# Patient Record
Sex: Female | Born: 1960 | Race: White | Hispanic: No | Marital: Married
Health system: Midwestern US, Community
[De-identification: ages and names within clinical notes are randomized; demographics above are authoritative.]

## PROBLEM LIST (undated history)

## (undated) DIAGNOSIS — F419 Anxiety disorder, unspecified: Secondary | ICD-10-CM

## (undated) DIAGNOSIS — F329 Major depressive disorder, single episode, unspecified: Secondary | ICD-10-CM

## (undated) DIAGNOSIS — M1990 Unspecified osteoarthritis, unspecified site: Secondary | ICD-10-CM

## (undated) DIAGNOSIS — F32A Depression, unspecified: Secondary | ICD-10-CM

## (undated) DIAGNOSIS — J449 Chronic obstructive pulmonary disease, unspecified: Secondary | ICD-10-CM

## (undated) DIAGNOSIS — E119 Type 2 diabetes mellitus without complications: Secondary | ICD-10-CM

## (undated) DIAGNOSIS — I10 Essential (primary) hypertension: Secondary | ICD-10-CM

## (undated) DIAGNOSIS — T7840XA Allergy, unspecified, initial encounter: Secondary | ICD-10-CM

## (undated) HISTORY — DX: Essential (primary) hypertension: I10

## (undated) HISTORY — PX: CHOLECYSTECTOMY: SHX55

## (undated) HISTORY — DX: Anxiety disorder, unspecified: F41.9

## (undated) HISTORY — DX: Chronic obstructive pulmonary disease, unspecified: J44.9

## (undated) HISTORY — DX: Depression, unspecified: F32.A

## (undated) HISTORY — PX: APPENDECTOMY: SHX54

## (undated) HISTORY — PX: COSMETIC SURGERY: SHX468

## (undated) HISTORY — DX: Type 2 diabetes mellitus without complications: E11.9

## (undated) HISTORY — PX: JOINT REPLACEMENT: SHX530

## (undated) HISTORY — DX: Unspecified osteoarthritis, unspecified site: M19.90

## (undated) HISTORY — DX: Major depressive disorder, single episode, unspecified: F32.9

## (undated) HISTORY — DX: Allergy, unspecified, initial encounter: T78.40XA

---

## 2014-09-19 ENCOUNTER — Ambulatory Visit (INDEPENDENT_AMBULATORY_CARE_PROVIDER_SITE_OTHER): Payer: TRICARE For Life (TFL)

## 2014-09-19 ENCOUNTER — Ambulatory Visit (INDEPENDENT_AMBULATORY_CARE_PROVIDER_SITE_OTHER): Payer: TRICARE For Life (TFL) | Admitting: Family Medicine

## 2014-09-19 VITALS — BP 160/62 | HR 78 | Temp 97.2°F | Resp 20 | Ht 64.75 in | Wt 185.6 lb

## 2014-09-19 DIAGNOSIS — M25561 Pain in right knee: Secondary | ICD-10-CM

## 2014-09-19 DIAGNOSIS — J4531 Mild persistent asthma with (acute) exacerbation: Secondary | ICD-10-CM

## 2014-09-19 DIAGNOSIS — T7840XA Allergy, unspecified, initial encounter: Secondary | ICD-10-CM

## 2014-09-19 MED ORDER — ALBUTEROL SULFATE HFA 108 (90 BASE) MCG/ACT IN AERS: 1.0000 | INHALATION_SPRAY | Freq: Four times a day (QID) | RESPIRATORY_TRACT | Status: AC | PRN

## 2014-09-19 MED ORDER — EPINEPHRINE 0.3 MG/0.3ML IJ SOAJ: 0.3000 mg | Freq: Once | INTRAMUSCULAR | Status: AC

## 2014-09-19 MED ORDER — PREDNISONE 20 MG PO TABS: ORAL_TABLET | ORAL | Status: AC

## 2014-09-19 NOTE — Progress Notes (Signed)
[facility] [address] [place] [address] [phone]  Date:  09/19/2014   Name:  Cheryl Parks   DOB:  May 06, 1960   MRN:  [id number]  PCP:  No PCP Per Patient    Chief Complaint: Shortness of Breath; Allergic Reaction; Pruritis; Knee Pain; Sore Throat; and Insomnia   History of Present Illness:  Cheryl Parks is a 54 y.o. very pleasant female patient who presents with the following:  She has thinning hair, and two days ago went to a stylist who did a "weave cap" for her. At first she was ok, but yesterday she noted redness and itching of her scalp and neck.  The itching seems to be spreading over her body.  She is taking benadryl but notes that her scalp is still very itchy.  She feels panicky due to her itching but does not have any wheezing or angioedema  She does have DM controlled with oral medications.  She is able to check her glucose at home and notes that it never gets over 175 or so.  She has used prednisone in the past without ill effeect  She has had her right knee replaced x2.  She twisted it yesterday and it is swollen and tender again- would like to have an x-ray to make sure that her hardware in is place  There are no active problems to display for this patient.   Past Medical History  Diagnosis Date  . Allergy   . Anxiety   . Arthritis   . COPD (chronic obstructive pulmonary disease)   . Depression   . Diabetes mellitus without complication   . Hypertension     Past Surgical History  Procedure Laterality Date  . Appendectomy    . Cholecystectomy    . Cosmetic surgery    . Joint replacement      History  Substance Use Topics  . Smoking status: Not on file  . Smokeless tobacco: Not on file  . Alcohol Use: Not on file    Family History  Problem Relation Age of Onset  . Cancer Mother   . Hyperlipidemia Mother   . Hypertension Mother   . Cancer Father   . Diabetes Father   . Hyperlipidemia Father   . Hypertension  Father     Allergies  Allergen Reactions  . Ciprofloxacin Hcl     Medication list has been reviewed and updated.  No current outpatient prescriptions on file prior to visit.   No current facility-administered medications on file prior to visit.    Review of Systems:  As per HPI- otherwise negative.   Physical Examination: Filed Vitals:   09/19/14 1527  BP: 160/62  Pulse: 78  Temp: 97.2 F (36.2 C)  Resp: 20   Filed Vitals:   09/19/14 1527  Height: 5' 4.75" (1.645 m)  Weight: 185 lb 9.6 oz (84.188 kg)   Body mass index is 31.11 kg/(m^2). Ideal Body Weight: Weight in (lb) to have BMI = 25: 148.8  GEN: WDWN, NAD, Non-toxic, A & O x 3, scratching at her skin, especially her scalp.  Redness and slight urticaria noted at her hairline HEENT: Atraumatic, Normocephalic. Neck supple. No masses, No LAD.  No angioedema Ears and Nose: No external deformity. CV: RRR, No M/G/R. No JVD. No thrill. No extra heart sounds. PULM: CTA B, no wheezes, crackles, rhonchi. No retractions. No resp. distress. No accessory muscle use. EXTR: No c/c/e NEURO Normal gait.  Right knee  shows large scars (old, healed) over anterior knee, some crepitus with ROM but full ROM.  Knee feels stable, no heat or effusion noted.  No redness PSYCH: Normally interactive. Conversant. Not depressed or anxious appearing.  Calm demeanor.   [facility] reading (PRIMARY) by  Dr. Patsy Lagerop[place]. 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  Removed as above, she felt better rx for epipen to have in case of more serious symptoms Course of prednisone for itching as below- she will watch her sugar Gave her a letter regarding her artificial joint to use for the metal detector when she visits her cousin in jail.    Signed Abbe AmsterdamJessica Ayeden Gladman, MD

## 2014-09-19 NOTE — Patient Instructions (Addendum)
Your knee looks ok- the hardware seems to be in place.  Please follow-up with your knee surgeon For your allergic reaction, continue using benadryl as needed for a couple of days You can also use the prednisone taper for 6 days- remember this will raise your blood sugar Keep an eye on your sugar- if going over 300 or so please let me know  Use the epipen if you have any difficulty breathing or swelling of your lips or tongue  Take an OTC zyrtec daily for the next several days  [facility] [address]

## 2014-09-20 ENCOUNTER — Emergency Department (INDEPENDENT_AMBULATORY_CARE_PROVIDER_SITE_OTHER)
Admission: EM | Admit: 2014-09-20 | Discharge: 2014-09-20 | Disposition: A | Discharge status: Home / Self Care | Source: Home / Self Care | Attending: Emergency Medicine | Admitting: Emergency Medicine

## 2014-09-20 ENCOUNTER — Encounter (HOSPITAL_COMMUNITY): Payer: Self-pay | Admitting: Emergency Medicine

## 2014-09-20 DIAGNOSIS — B085 Enteroviral vesicular pharyngitis: Secondary | ICD-10-CM | POA: Diagnosis not present

## 2014-09-20 DIAGNOSIS — H6983 Other specified disorders of Eustachian tube, bilateral: Secondary | ICD-10-CM | POA: Diagnosis not present

## 2014-09-20 LAB — POCT RAPID STREP A: Streptococcus, Group A Screen (Direct): NEGATIVE

## 2014-09-20 MED ORDER — LIDOCAINE VISCOUS 2 % MT SOLN: 5.0000 mL | OROMUCOSAL | Status: AC | PRN

## 2014-09-20 NOTE — Discharge Instructions (Signed)
You have a virus causing your symptoms. Use the viscous lidocaine every 4 hours as needed for pain. You should see improvement in the next 3-5 days. Follow-up as needed.

## 2014-09-20 NOTE — ED Notes (Signed)
Pt comes in with c/o sore throat, hoarse and blisters on tongue States she was seen in another Urgent Care last week, treated for Allergic reaction with oral steroids Rapid strep obtained

## 2014-09-20 NOTE — ED Provider Notes (Signed)
CSN: [id number]     Arrival date & time 09/20/14  1729 History   First MD Initiated Contact with Patient 09/20/14 1854     Chief Complaint  Patient presents with  . Sore Throat   (Consider location/radiation/quality/duration/timing/severity/associated sxs/prior Treatment) HPI  Past Medical History  Diagnosis Date  . Allergy   . Anxiety   . Arthritis   . COPD (chronic obstructive pulmonary disease)   . Depression   . Diabetes mellitus without complication   . Hypertension    Past Surgical History  Procedure Laterality Date  . Appendectomy    . Cholecystectomy    . Cosmetic surgery    . Joint replacement     Family History  Problem Relation Age of Onset  . Cancer Mother   . Hyperlipidemia Mother   . Hypertension Mother   . Cancer Father   . Diabetes Father   . Hyperlipidemia Father   . Hypertension Father    History  Substance Use Topics  . Smoking status: Not on file  . Smokeless tobacco: Not on file  . Alcohol Use: Not on file   OB History    No data available     Review of Systems  Allergies  Ciprofloxacin hcl  Home Medications   Prior to Admission medications   Medication Sig Start Date End Date Taking? Authorizing Provider  albuterol (PROVENTIL HFA;VENTOLIN HFA) 108 (90 BASE) MCG/ACT inhaler Inhale 1-2 puffs into the lungs every 6 (six) hours as needed for wheezing or shortness of breath. 09/19/14   Gwenlyn FoundJessica C Copland, MD  amLODipine (NORVASC) 10 MG tablet Take 10 mg by mouth daily.    Historical Provider, MD  aspirin 81 MG tablet Take 81 mg by mouth daily.    Historical Provider, MD  atorvastatin (LIPITOR) 10 MG tablet Take 10 mg by mouth daily.    Historical Provider, MD  B Complex Vitamins (B COMPLEX-B12 PO) Take by mouth.    Historical Provider, MD  B Complex-C-E-Zn (STRESS B/ZINC) TABS Take by mouth.    Historical Provider, MD  busPIRone (BUSPAR) 10 MG tablet Take 10 mg by mouth 3 (three) times daily.    Historical Provider, MD  cyclobenzaprine  (AMRIX) 15 MG 24 hr capsule Take 15 mg by mouth daily as needed for muscle spasms.    Historical Provider, MD  doxepin (SINEQUAN) 10 MG capsule Take 10 mg by mouth at bedtime.    Historical Provider, MD  EPINEPHrine 0.3 mg/0.3 mL IJ SOAJ injection Inject 0.3 mLs (0.3 mg total) into the muscle once. Use if needed for allergic reaction emergency 09/19/14   Pearline CablesJessica C Copland, MD  fenofibrate (TRICOR) 145 MG tablet Take 145 mg by mouth daily.    Historical Provider, MD  FLUoxetine (PROZAC) 40 MG capsule Take 40 mg by mouth daily.    Historical Provider, MD  GREEN COFFEE BEAN PO Take by mouth.    Historical Provider, MD  HYDROmorphone HCl (EXALGO) 12 MG T24A SR tablet Take 12 mg by mouth 2 (two) times daily.    Historical Provider, MD  lidocaine (XYLOCAINE) 2 % solution Use as directed 5 mLs in the mouth or throat every 4 (four) hours as needed for mouth pain. 09/20/14   Charm RingsErin J Tesla Keeler, MD  LOSARTAN POTASSIUM PO Take by mouth.    Historical Provider, MD  Melatonin 1 MG TABS Take by mouth.    Historical Provider, MD  metFORMIN (GLUCOPHAGE) 1000 MG tablet Take 1,000 mg by mouth 2 (two) times daily with a  meal.    Historical Provider, MD  NON FORMULARY     Historical Provider, MD  oxycodone (ROXICODONE) 30 MG immediate release tablet Take 30 mg by mouth every 4 (four) hours as needed for pain.    Historical Provider, MD  predniSONE (DELTASONE) 20 MG tablet Take 2 pills a day for 3 days, then 1 pill a day for 3 days 09/19/14   Pearline Cables, MD  promethazine (PHENERGAN) 25 MG tablet Take 25 mg by mouth every 6 (six) hours as needed for nausea or vomiting.    Historical Provider, MD  sitaGLIPtin (JANUVIA) 100 MG tablet Take 100 mg by mouth daily.    Historical Provider, MD  tiZANidine (ZANAFLEX) 4 MG capsule Take 4 mg by mouth daily.    Historical Provider, MD  UNABLE TO FIND Steroid scalp injection    Historical Provider, MD   Pulse 92  Temp(Src) 98.6 F (37 C) (Oral)  Resp 14  SpO2 100% Physical  Exam  ED Course  Procedures (including critical care time) Labs Review Labs Reviewed  POCT RAPID STREP A    Imaging Review Dg Knee Complete 4 Views Right  09/20/2014   CLINICAL DATA:  Fall and twisted right knee. Lateral right knee pain. Initial encounter.  EXAM: RIGHT KNEE - COMPLETE 4+ VIEW  COMPARISON:  None.  FINDINGS: There is no evidence of fracture, dislocation, or joint effusion. Total knee arthroplasty seen with all 3 components in expected position. No other significant bone abnormality identified.  IMPRESSION: No acute findings.  Previous total knee arthroplasty.   Electronically Signed   By: Myles Rosenthal M.D.   On: 09/20/2014 08:36     MDM   1. Herpangina    This appears to be viral. Viscous lidocaine for comfort. Follow-up as needed.    Charm Rings, MD 09/20/14 2006

## 2014-09-20 NOTE — ED Provider Notes (Signed)
Formatting of this note is different from the original.  CSN: [id number]     Arrival date & time 09/20/14  1729  History    First MD Initiated Contact with Patient 09/20/14 1854      Chief Complaint   Patient presents with   ? Sore Throat     (Consider location/radiation/quality/duration/timing/severity/associated sxs/prior  Treatment)  HPI    Past Medical History   Diagnosis Date   ? Allergy    ? Anxiety    ? Arthritis    ? COPD (chronic obstructive pulmonary disease)    ? Depression    ? Diabetes mellitus without complication    ? Hypertension      Past Surgical History   Procedure Laterality Date   ? Appendectomy     ? Cholecystectomy     ? Cosmetic surgery     ? Joint replacement       Family History   Problem Relation Age of Onset   ? Cancer Mother    ? Hyperlipidemia Mother    ? Hypertension Mother    ? Cancer Father    ? Diabetes Father    ? Hyperlipidemia Father    ? Hypertension Father      History   Substance Use Topics   ? Smoking status: Not on file   ? Smokeless tobacco: Not on file   ? Alcohol Use: Not on file     OB History     No data available       Review of Systems    Allergies   Ciprofloxacin hcl    Home Medications     Prior to Admission medications    Medication Sig Start Date End Date Taking? Authorizing Provider   albuterol (PROVENTIL HFA;VENTOLIN HFA) 108 (90 BASE) MCG/ACT inhaler Inhale 1-2 puffs into the lungs every 6 (six) hours as needed for wheezing or shortness of breath. 09/19/14   Gwenlyn FoundJessica C Copland, MD   amLODipine (NORVASC) 10 MG tablet Take 10 mg by mouth daily.    Historical Provider, MD   aspirin 81 MG tablet Take 81 mg by mouth daily.    Historical Provider, MD   atorvastatin (LIPITOR) 10 MG tablet Take 10 mg by mouth daily.    Historical Provider, MD   B Complex Vitamins (B COMPLEX-B12 PO) Take by mouth.    Historical Provider, MD   B Complex-C-E-Zn (STRESS B/ZINC) TABS Take by mouth.    Historical Provider, MD   busPIRone (BUSPAR) 10 MG tablet Take 10 mg by mouth 3 (three) times  daily.    Historical Provider, MD   cyclobenzaprine (AMRIX) 15 MG 24 hr capsule Take 15 mg by mouth daily as needed for muscle spasms.    Historical Provider, MD   doxepin (SINEQUAN) 10 MG capsule Take 10 mg by mouth at bedtime.    Historical Provider, MD   EPINEPHrine 0.3 mg/0.3 mL IJ SOAJ injection Inject 0.3 mLs (0.3 mg total) into the muscle once. Use if needed for allergic reaction emergency 09/19/14   Pearline CablesJessica C Copland, MD   fenofibrate (TRICOR) 145 MG tablet Take 145 mg by mouth daily.    Historical Provider, MD   FLUoxetine (PROZAC) 40 MG capsule Take 40 mg by mouth daily.    Historical Provider, MD   GREEN COFFEE BEAN PO Take by mouth.    Historical Provider, MD   HYDROmorphone HCl (EXALGO) 12 MG T24A SR tablet Take 12 mg by mouth 2 (two) times daily.  Historical Provider, MD   lidocaine (XYLOCAINE) 2 % solution Use as directed 5 mLs in the mouth or throat every 4 (four) hours as needed for mouth pain. 09/20/14   Charm Rings, MD   LOSARTAN POTASSIUM PO Take by mouth.    Historical Provider, MD   Melatonin 1 MG TABS Take by mouth.    Historical Provider, MD   metFORMIN (GLUCOPHAGE) 1000 MG tablet Take 1,000 mg by mouth 2 (two) times daily with a meal.    Historical Provider, MD   NON FORMULARY     Historical Provider, MD   oxycodone (ROXICODONE) 30 MG immediate release tablet Take 30 mg by mouth every 4 (four) hours as needed for pain.    Historical Provider, MD   predniSONE (DELTASONE) 20 MG tablet Take 2 pills a day for 3 days, then 1 pill a day for 3 days 09/19/14   Pearline Cables, MD   promethazine (PHENERGAN) 25 MG tablet Take 25 mg by mouth every 6 (six) hours as needed for nausea or vomiting.    Historical Provider, MD   sitaGLIPtin (JANUVIA) 100 MG tablet Take 100 mg by mouth daily.    Historical Provider, MD   tiZANidine (ZANAFLEX) 4 MG capsule Take 4 mg by mouth daily.    Historical Provider, MD   UNABLE TO FIND Steroid scalp injection    Historical Provider, MD     Pulse 92  Temp(Src) 98.6 F  (37 C) (Oral)  Resp 14  SpO2 100%  Physical Exam    ED Course   Procedures (including critical care time)  Labs Review  Labs Reviewed   POCT RAPID STREP A     Imaging Review  Dg Knee Complete 4 Views Right    09/20/2014   CLINICAL DATA:  Fall and twisted right knee. Lateral right knee pain. Initial encounter.  EXAM: RIGHT KNEE - COMPLETE 4+ VIEW  COMPARISON:  None.  FINDINGS: There is no evidence of fracture, dislocation, or joint effusion. Total knee arthroplasty seen with all 3 components in expected position. No other significant bone abnormality identified.  IMPRESSION: No acute findings.  Previous total knee arthroplasty.   Electronically Signed   By: Myles Rosenthal M.D.   On: 09/20/2014 08:36     MDM     1. Herpangina      This appears to be viral.  Viscous lidocaine for comfort.  Follow-up as needed.    Charm Rings, MD  09/20/14 2006  Electronically signed by Charm Rings, MD at 09/20/2014  8:06 PM EDT

## 2014-09-20 NOTE — ED Notes (Signed)
Formatting of this note might be different from the original.  Pt comes in with c/o sore throat, hoarse and blisters on tongue  States she was seen in another Urgent Care last week, treated for Allergic reaction with oral steroids  Rapid strep obtained      Electronically signed by Nonnie DoneSmith, Jill D at 09/20/2014  7:12 PM EDT

## 2014-09-23 LAB — CULTURE, GROUP A STREP: STREP A CULTURE: NEGATIVE

## 2014-09-24 NOTE — ED Notes (Signed)
Final report of strep test negative for strep 

## 2014-09-24 NOTE — ED Notes (Signed)
Formatting of this note might be different from the original.  Final report of strep test negative for strep   Electronically signed by Lynetta MareStack, Nancy G, RN at 09/24/2014  4:57 PM EDT

## 2016-01-26 IMAGING — CR DG KNEE COMPLETE 4+V*R*
4 series · 4 of 4 positions shown · non-contrast
Comparison: None.

CLINICAL DATA: Fall and twisted right knee. Lateral right knee
pain. Initial encounter.

EXAM:
RIGHT KNEE - COMPLETE 4+ VIEW

[AP]
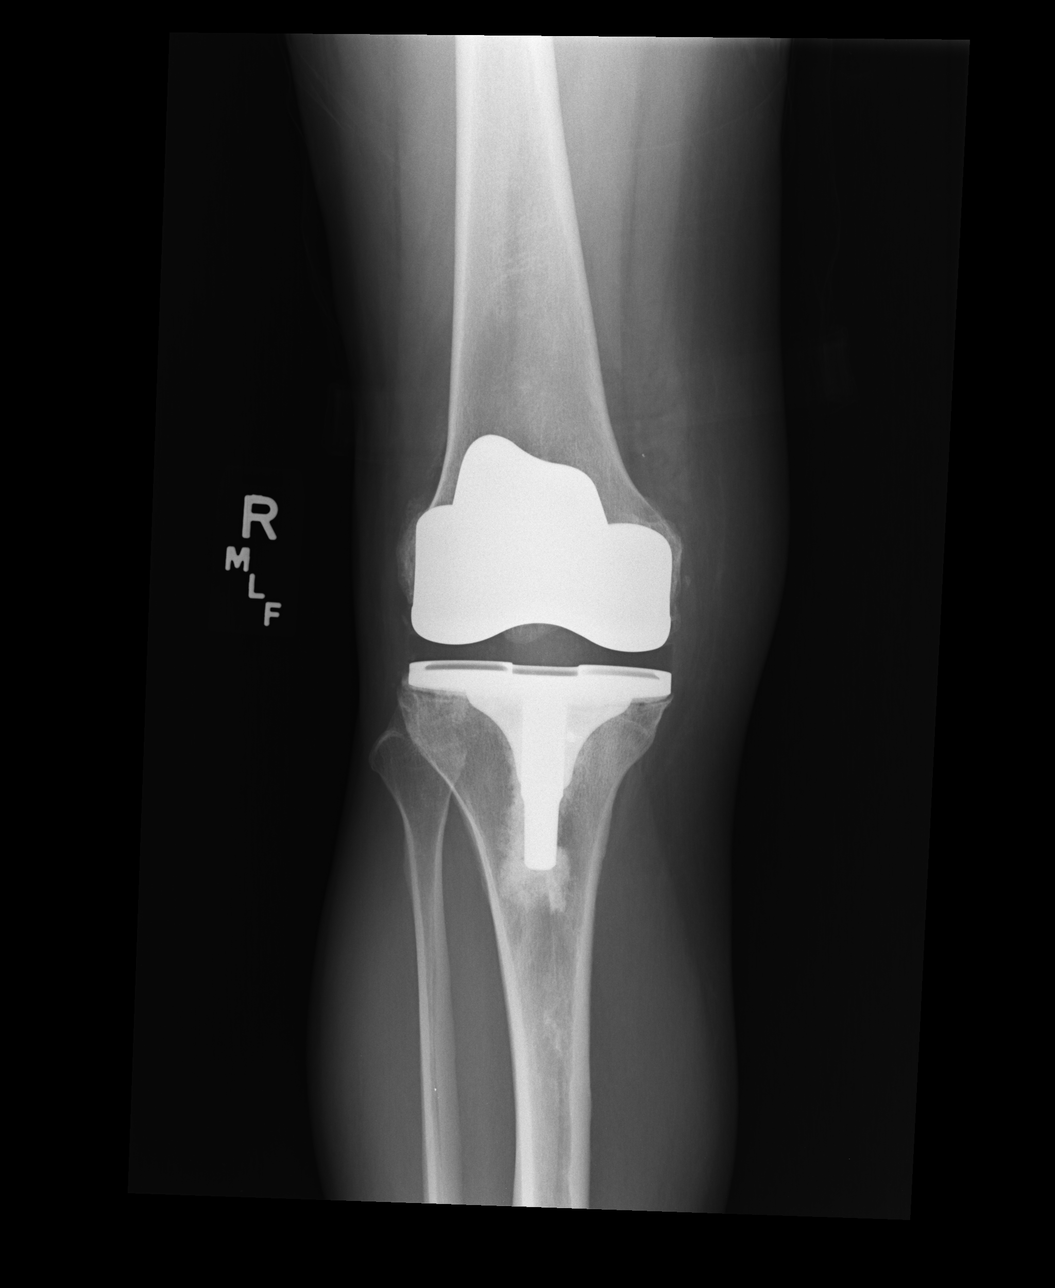

[lateral]
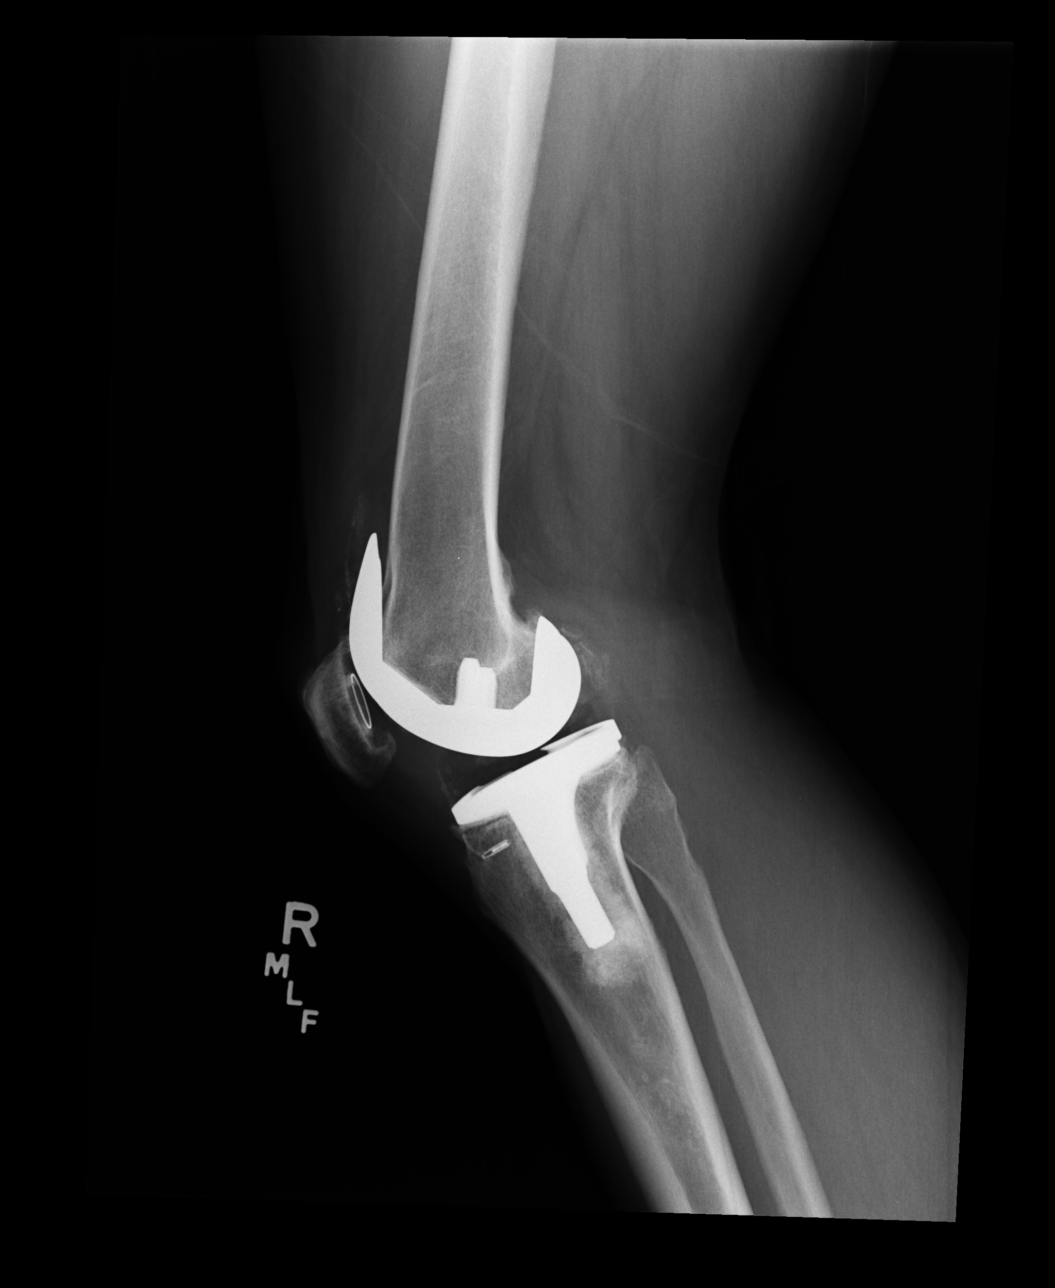

[ap ext rot]
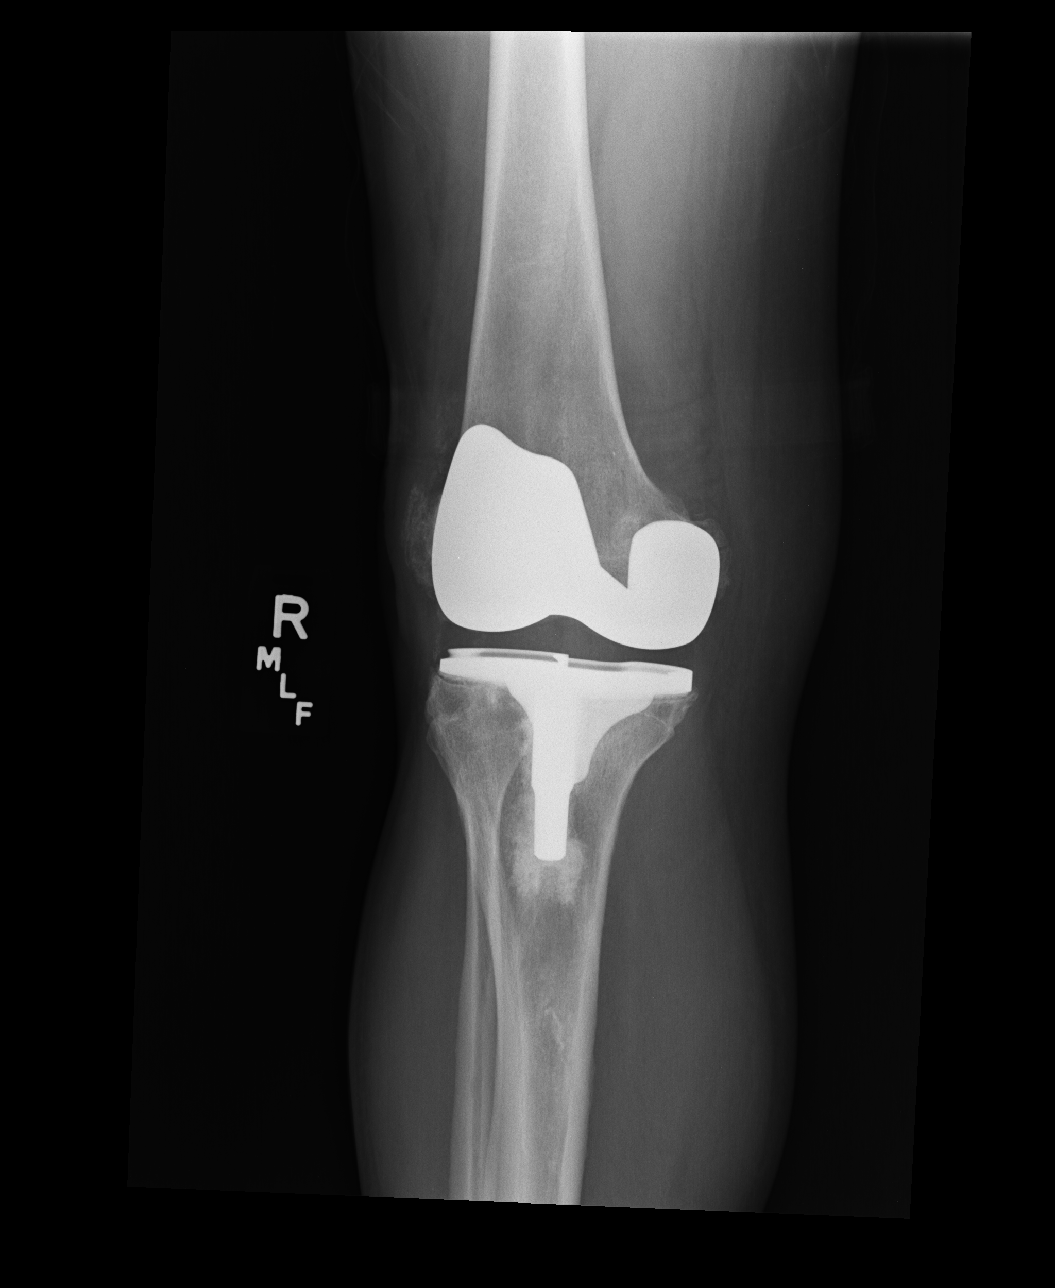

[ap int rot]
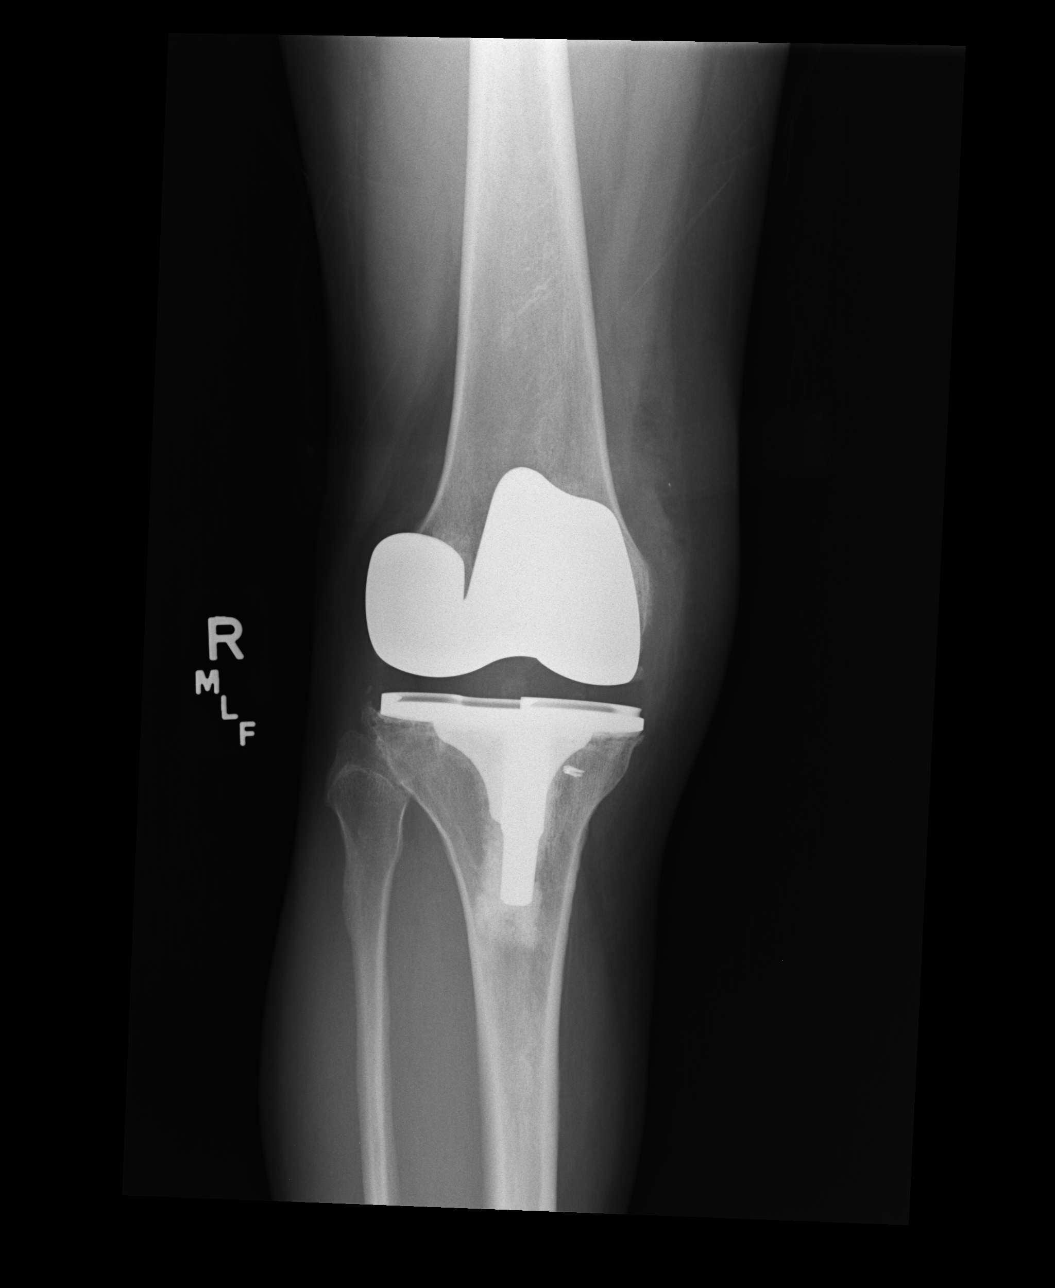

[4 of 4 positions shown; findings below may reference images not displayed]

FINDINGS: There is no evidence of fracture, dislocation, or joint effusion.
Total knee arthroplasty seen with all 3 components in expected
position. No other significant bone abnormality identified.
IMPRESSION: No acute findings.  Previous total knee arthroplasty.

## 2021-10-20 LAB — HEMOGLOBIN A1C
Estimated Avg Glucose, External: 164 mg/dL — ABNORMAL HIGH (ref 91–123)
Hemoglobin A1C, External: 7.4 % — ABNORMAL HIGH (ref 4.8–5.6)

## 2022-01-11 ENCOUNTER — Inpatient Hospital Stay: Admit: 2022-01-11 | Payer: TRICARE (CHAMPUS) | Attending: Cardiovascular Disease | Primary: Family Medicine

## 2022-01-11 DIAGNOSIS — R0789 Other chest pain: Secondary | ICD-10-CM

## 2022-01-11 LAB — EKG 12-LEAD
Atrial Rate: 74 {beats}/min
Calculated P Axis: 40 degrees
Calculated R Axis: -7 degrees
Calculated T Axis: 57 degrees
P-R Interval: 148 ms
Q-T Interval: 394 ms
QRS Duration: 90 ms
QTC Calculation (Bezet): 437 ms
Ventricular Rate: 74 {beats}/min

## 2022-01-11 LAB — POC CHEM 8
BUN: 15 mg/dl (ref 7–25)
Calcium, Ionized: 5 mg/dL (ref 4.40–5.40)
Chloride: 105 mEq/L (ref 98–107)
Creatinine: 0.6 mg/dl (ref 0.6–1.3)
Glucose: 135 mg/dL — ABNORMAL HIGH (ref 74–106)
Hematocrit: 41 % (ref 38–45)
Hemoglobin: 13.9 gm/dl (ref 12.4–17.2)
Potassium: 3.8 mEq/L (ref 3.5–4.9)
Sodium: 140 mEq/L (ref 136–145)
Total CO2: 25 mmol/L (ref 24–29)

## 2022-01-11 LAB — POCT PT/INR
INR: 0.9 (ref 0.0–1.1)
Protime: 11.4 seconds (ref 0.0–14.0)

## 2022-01-11 LAB — CBC WITH AUTO DIFFERENTIAL
Basophils: 0.5 % (ref 0–3)
Eosinophils: 1.9 % (ref 0–5)
Hematocrit: 38.3 % (ref 35.0–47.0)
Hemoglobin: 12.9 gm/dl (ref 11.0–16.0)
Immature Granulocytes: 0.4 % (ref 0.0–3.0)
Lymphocytes: 39.9 % (ref 28–48)
MCH: 29.3 pg (ref 25.4–34.6)
MCHC: 33.7 gm/dl (ref 30.0–36.0)
MCV: 86.8 fL (ref 80.0–98.0)
MPV: 11.1 fL — ABNORMAL HIGH (ref 6.0–10.0)
Monocytes: 5.1 % (ref 1–13)
Neutrophils Segmented: 52.2 % (ref 34–64)
Nucleated RBCs: 0 (ref 0–0)
Platelets: 237 10*3/uL (ref 140–450)
RBC: 4.41 M/uL (ref 3.60–5.20)
RDW: 38.5 (ref 36.4–46.3)
WBC: 11.3 10*3/uL — ABNORMAL HIGH (ref 4.0–11.0)

## 2022-01-11 MED ORDER — FENTANYL CITRATE (PF) 100 MCG/2ML IJ SOLN
100 MCG/2ML | INTRAMUSCULAR | Status: DC | PRN
Start: 2022-01-11 — End: 2022-01-11
  Administered 2022-01-11 (×2): 50 ug via INTRAVENOUS

## 2022-01-11 MED ORDER — ACETAMINOPHEN 325 MG PO TABS
325 MG | ORAL | Status: DC | PRN
Start: 2022-01-11 — End: 2022-01-11
  Administered 2022-01-11: 22:00:00 650 mg via ORAL

## 2022-01-11 MED ORDER — IOPAMIDOL 61 % IV SOLN
61 % | Freq: Once | INTRAVENOUS | Status: AC | PRN
Start: 2022-01-11 — End: 2022-01-11
  Administered 2022-01-11: 19:00:00 50 mL

## 2022-01-11 MED ORDER — HEPARIN (PORCINE) IN NACL 1000-0.9 UT/500ML-% IV SOLN
INTRAVENOUS | Status: DC
Start: 2022-01-11 — End: 2022-01-11
  Administered 2022-01-11: 19:00:00 3 mL/h

## 2022-01-11 MED ORDER — MIDAZOLAM HCL 2 MG/2ML IJ SOLN
2 MG/ML | INTRAMUSCULAR | Status: DC | PRN
Start: 2022-01-11 — End: 2022-01-11
  Administered 2022-01-11 (×2): 1 mg via INTRAVENOUS

## 2022-01-11 MED ORDER — NORMAL SALINE FLUSH 0.9 % IV SOLN
0.9 % | INTRAVENOUS | Status: DC | PRN
Start: 2022-01-11 — End: 2022-01-11

## 2022-01-11 MED ORDER — NORMAL SALINE FLUSH 0.9 % IV SOLN
0.9 % | Freq: Two times a day (BID) | INTRAVENOUS | Status: DC
Start: 2022-01-11 — End: 2022-01-11

## 2022-01-11 MED ORDER — LIDOCAINE HCL 1 % IJ SOLN
1 % | INTRAMUSCULAR | Status: DC | PRN
Start: 2022-01-11 — End: 2022-01-11
  Administered 2022-01-11: 19:00:00 2 mL via INTRADERMAL

## 2022-01-11 MED ORDER — HEPARIN SODIUM (PORCINE) 1000 UNIT/ML IJ SOLN
1000 UNIT/ML | INTRAMUSCULAR | Status: DC | PRN
Start: 2022-01-11 — End: 2022-01-11
  Administered 2022-01-11: 19:00:00 3000 [IU] via INTRAVENOUS

## 2022-01-11 MED ORDER — SODIUM CHLORIDE 0.9 % IV SOLN
0.9 % | INTRAVENOUS | Status: DC | PRN
Start: 2022-01-11 — End: 2022-01-11

## 2022-01-11 MED FILL — FENTANYL CITRATE (PF) 100 MCG/2ML IJ SOLN: 100 MCG/2ML | INTRAMUSCULAR | Qty: 2

## 2022-01-11 MED FILL — LIDOCAINE HCL 1 % IJ SOLN: 1 % | INTRAMUSCULAR | Qty: 20

## 2022-01-11 MED FILL — HEPARIN SODIUM (PORCINE) 1000 UNIT/ML IJ SOLN: 1000 UNIT/ML | INTRAMUSCULAR | Qty: 10

## 2022-01-11 MED FILL — MIDAZOLAM HCL 2 MG/2ML IJ SOLN: 2 MG/ML | INTRAMUSCULAR | Qty: 2

## 2022-01-11 MED FILL — ACETAMINOPHEN 325 MG PO TABS: 325 MG | ORAL | Qty: 2

## 2022-01-11 MED FILL — HEPARIN (PORCINE) IN NACL 1000-0.9 UT/500ML-% IV SOLN: INTRAVENOUS | Qty: 1500

## 2022-01-11 MED FILL — ISOVUE-300 61 % IV SOLN: 61 % | INTRAVENOUS | Qty: 50

## 2022-01-11 NOTE — Procedures (Signed)
Cardiac Catheterization Procedure Note    Patient: Kelly Gill Age: 61 y.o. Sex: female    Date of Birth: 1960/11/18 Admit Date: 01/11/2022 PCP: Mardene Sayer, MD   MRN: [id number]  CSN: [id number]           DATE: January 11, 2022   PHYSICIAN: Dr. Lovenia Shuck, MD, [facility], [facility], [facility]    POSTOPERATIVE DIAGNOSIS:   Chest Pain/Angina pectoris  SOB  HTN  T2DM    INDICATIONS:   Chest Pain/Angina pectoris  SOB  HTN  T2DM    PROCEDURE PERFORMED:   1. Left heart cardiac catheterization.  2. Selective left and right coronary angiography.   3. Left ventriculography.   4. Aortic root angiography  5. Right radial arterial access using ultrasound guidance  6. Radiologic supervision and interpretation    DESCRIPTION OF PROCEDURE:   After informed consent where the procedure was discussed both during outpatient evaluation and again immediately prior to the procedure, the patient was brought to the cardiac catheterization suite. The patient was prepped and draped in the usual sterile fashion. After documentation of normal Allen and reverse Allen tests right radial artery was used for the procedure. After local lidocaine infiltration above the radial artery, right radial access was achieved using ultrasound guidance and with micro puncture needle.  A 6Fr glide sheath was advanced and placed into the right radial artery using Seldinger technique.  Combination of heparin, verapamil and nitroglycerin were injected into the artery through the sheath. A 5Fr. TIG catheter was advanced over J-glide wire and used to perform left ventriculography, selective right and left coronary angiography and aortic root angiography with images obtained in multiple projections and angulations.      At the conclusion, all procedure catheters and wires were removed. Hemostasis was to be achieved using manual pressure with patient instructed to keep the right arm straight for 2 hours.    MODERATE CONSCIOUS SEDATION:  Sedation was provided under my direct  supervision with a sedation trained nurse using 1 mg of IV Versed and 50 mcg of IV Fentanyl. See hospital trained nurse sedation orders for pre and post service care.     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   LAD: The LAD is a large sized vessel with distal artery 60% narrowing with normal flow.  Circumflex: The circumflex coronary artery was a large vessel and appeared normal with normal flow.  RCA: The RCA was a large dominant artery with mid artery 30% narrowing with normal flow.    LEFT VENTRICULOGRAPHY:   In the RAO projection showed normal  LV systolic function and contractility with LVEF 65%, LVEDP 14 mmHg. There was no mitral regurgitation and no aortic stenosis.     AORTIC ROOT ANGIOGRAPHY:   Demonstrated normal type 1 aortic root with no aortic insufficiency.     PLAN and RECOMMENDATION:  Medical management focused on prevention and disease modification.       Impression/Plan/Discharge Summary  1. Discharge when post sedation and discharge criteria met   2. Resume regular diet as tolerated  3. Discharge instructions to be given on activity at discharge  4. Outpatient follow up appointment  Lovenia Shuck, MD  January 11, 2022  3:08 PM

## 2022-01-11 NOTE — Progress Notes (Signed)
RN removed TRBAND with no complications and followed policy procedure with intermittent air removal. No bleeding. Pt is A&Ox4 on discharge.

## 2022-01-11 NOTE — Progress Notes (Signed)
DISCHARGE SUMMARY from Nurse  ?  ?  PATIENT INSTRUCTIONS:  ?  Notify you Physician if you experience:  Increased Shortness of Breath   Dizziness   Fainting   Black Stools   Vomiting   Coughing White, Frothy or Bloody Sputum   Dry cough that will not go away   Increased swelling of arms, legs, ankles or abdomen   Develop a rash   Difficulty breathing while laying down   Urine problems: pain, burning, urgency or difficulty   Temperature greater than 100 degrees F, shaking, chills for more than 24 hours   Increased skin bruising   Sore Mouth, throat or gums   Increased cough, fatigue   Clicking/popping sound in a joint   Increased limb shortening or turning outward   ?  ?  Call you Doctor right away if you have new symptoms such as:  Cough that is worse at night and when you are lying down   Swelling in your legs, ankles, feet, abdomen and/or veins in the neck   ?  Lifestyle Tips:  Appointment after discharge and follow up phone call:  After being discharged, if your appointment does not work for you, please feel free to contact the physician's office to change the appointment.  We want to make sure you are doing well after you have left the hospital. You will receive a phone call on behalf of [facility] to follow up with you within 24-72 hours of your discharge from the hospital.  This phone call will help us provide you with the best possible care. Please take the time to answer all questions you are asked so we can help you stay safe in your home before your next doctor's appointment.  ?  Medications:  Take your medicines exactly as instructed.  Ask your doctor or nurse if you have questions about your medicines.  Tell your doctor right away if you have problems with your medicines.  Activity:  Ask your doctor how active you should be. Remember to take rest breaks.  Do not cross your legs when sitting.  Elevate your legs when you can.  Diet:  Follow any special diet orders from your  doctor.  Ask your doctor how much salt (sodium) you should have each day.  Ask if you need to limit the amount or types of fluids you drink each day.  Weight Monitoring:  If you are overweight, ask about a weight loss plan that is right for you.  Weight gain may mean that fluids are building up in your body.  Weigh yourself every day at about the same time and write it down.  Do Not Smoke:  If you smoke, quit. Smoking is bad for your health.  Avoid second hand smoke.  Call the [place] QUITLINE for help at 1-800-784-8669 (1-800-Quit - Now)  Other Tips:  Avoid people with the flu or pneumonia  Keep all of your doctor appointments  Carry a list of your medications, allergies and the shots you have had  ?  ?  ?  These are general instructions for a healthy lifestyle:  ?  No smoking/ No tobacco products/ Avoid exposure to second hand smoke  ?  Surgeon General's Warning: Quitting smoking now greatly reduces serious risk to your health.  ?  Obesity, smoking, and sedentary lifestyle greatly increases your risk for illness  ?  A healthy diet, regular physical exercise & weight monitoring are important for maintaining a healthy lifestyle  ?    You may be retaining fluid if you have a history of heart failure or if you experience any of the following symptoms: Weight gain of 3 pounds or more overnight or 5 pounds in a week, increased swelling in our hands or feet or shortness of breath while lying flat in bed. Please call your doctor as soon as you notice any of these symptoms; do not wait until your next office visit.  ?  Recognize signs and symptoms of STROKE:  ?  F-face looks uneven  ?  A-arms unable to move or move unevenly  ?  S-speech slurred or non-existent  ?  T-time-call 911 as soon as signs and symptoms begin-DO NOT go   Back to bed or wait to see if you get better-TIME IS BRAIN.  ?  Warning Signs of HEART ATTACK   ?  Call 911 if you have these symptoms:  Chest discomfort. Most heart attacks involve discomfort in the  center of the chest that lasts more than a few minutes, or that goes away and comes back. It can feel like uncomfortable pressure, squeezing, fullness, or pain.   Discomfort in other areas of the upper body. Symptoms can include pain or discomfort in one or both arms, the back, neck, jaw, or stomach.   Shortness of breath with or without chest discomfort.   Other signs may include breaking out in a cold sweat, nausea, or lightheadedness.   Don't wait more than five minutes to call 911 - MINUTES MATTER! Fast action can save your life. Calling 911 is almost always the fastest way to get lifesaving treatment. Emergency Medical Services staff can begin treatment when they arrive -- up to an hour sooner than if someone gets to the hospital by car.   Myself and/or my family have received education about my diagnosis throughout my hospital stay. During my hospital stay, I and/or my family/caregiver were included in planning my care upon discharge. My needs were taken into consideration and I was included in my discharge planning. I had an opportunity to ask questions.   The discharge information has been reviewed with the patient. The patient verbalized understanding.  ?  ?  Discharge medications reviewed with the patient and appropriate educational materials and side effects teaching were provided. Pt left A&Ox4, VSS with all belongings. Pt left via wheelchair accompanied by RN.

## 2022-01-11 NOTE — H&P (Signed)
History and Physical Note      Patient: Kelly Gill Age: 61 y.o. Sex: female    Date of Birth: 1960/12/03 Admit Date: 01/11/2022 PCP: Mardene Sayer, MD   MRN: [id number]  CSN: [id number]        H&P/Interval Update Note: The H&P has been reviewed, the patient has been examined and I concur with the findings of the H&P.  There are no significant changes.  It is appropriate to proceed with the planned procedure. 60 yo with angina with continued chest pain in setting of multiple significnat risk factors including DM, smoking, HTN, HLD and strong family history with angiography recommended despite an unrevealing funcitonal study    Review of Symptoms/Physical Examination (select if normal, otherwise findings as documented):  Head/Neck, Airway, Chest/Lungs, Heart, Abdomen, GI, GU, Extremities and Neurological    EXAM:  There were no vitals filed for this visit.     Assessment:  Angina  HTN  HLD  T2DM  Smoker  Family history of premature ASCVD    Plan:  Cardiac Catheterization and PCI if indicated.    Informed Consent:  I have discussed the planned procedure and any reasonable alternative options, including the risks, benefits, potential complications and side effects associated with the planned procedure and alternatives (including the risks of not undergoing the procedure), potential problems that might occur during recuperation, and the likelihood of achieving goals, the name of any additional practitioners performing any other specified significant tasks during the procedure, and the plan for sedation or anesthesia with the patient and (if present) family.  The patient was provided an opportunity to ask questions; all questions were answered.  After careful consideration, the patient wishes to proceed with the planned procedure.  ________________________________________________________________________    Indication(s) for Cath Lab Visit: Angina     Chest Pain Symptom Assessment: Angina    Anginal Class w/in 2 weeks:  CCS II - Slight limitation, with angina only during vigorous physical activity    Heart Failure: YES/NO: No    Stress or Imaging Study performed: No ischemia on SPECT  _______________________________________________________________________    Pre-Sedation Assessment For Procedures Without An Anesthesia Provider  (must be completed within 48 hours prior to procedure)    1)  Plan for sedation/anesthesia:  Lidocaine       Target sedation level:  Moderate        IV sedation medications:  Versed and Fentanyl    2)  ASA (American Society of Anesthesiologists) patient classification: Class 2 - A normal healthy patient with mild systemic disease    3)  Mallampati Score: Mallampati: I (soft palate, uvula, fauces, tonsillar pillars visible)  ________________________________________________________________________    Immediate Pre-Sedation Assessment  (Complete immediately prior to procedural sedation if there is no anesthesia provider)    The patient was examined immediately prior to sedation and is deemed to be an appropriate candidate for the planned sedation.    Simeon Craft, MD   January 11, 2022   12:03 PM

## 2022-01-11 NOTE — Discharge Instructions (Signed)
[facility]  [phone]    General Anesthesia or Sedation  Do Not drive or operate machinery for 24 Hours.  Do not make important decisions or sign any important papers in the next 24 hours.    Activity  Restrict your activities and rest for a day.  Resume light to normal activity tomorrow.  No heavy lifting, straining or exercising for the next 24 hours.  You may only sponge bath today, but can shower tomorrow.    Oral Fluids and Diet  Drink plenty of water, NO alcoholic drinks or caffeine for 24 hours.  If not nauseated, resume your regular diet.     Follow Up Care  Call you doctor if you have any problems that concerns you.  After hours, you can reach your doctor through the answering service.    Specific Complications you should call your doctor.  Fever over 101 Fahrenheit, by Mouth.  Pain not relieved by your usual medicine.  Increase redness, warmth, or hardness around the procedure site.  An increase in swelling, bleeding, or pus-like drainage from procedure site.    Medication  Resume all your usual medications.  You may take a non-prescription headache remedy type medication that you normally use, if doctor permits.    Procedure Site  Keep dressing clean and dry.  Remove band-aid/ dressing after 24 hours.       Left Heart Catheterization: About This Test  What is it?     Left heart catheterization is a test to check the left side of your heart. Your doctor might check the structure of your heart, the motion of your heart, or the blood pressure inside the chambers.  Why is this test done?  This test gives information about how your heart is working. It can:  Check blood flow and blood pressure in the chambers of the heart.  Check the pumping action of the heart.  Find out if a heart defect is present and how severe it is.  Find out how well the heart valves work.  How is the test done?  You may get medicine to help you relax.  You will get a shot to numb the skin where the catheter  goes in.  A thin tube called a catheter is put into a blood vessel in your groin or wrist. The doctor moves the catheter through the blood vessel into your heart.  Dye may be injected into your heart. Your doctor can watch on special monitors as the dye moves in your heart. The dye helps your doctor see blood flow in your heart.  If a heart defect is found, cardiac catheterization sometimes is used to repair it during the test.  After the procedure, pressure may be applied for a short time to the area where the catheter was put into your blood vessel. This will help prevent bleeding. A small device may also be used to close the blood vessel. You may have a bandage or compression device on the catheter site.  You will stay in a room for at least a few hours to make sure the catheter site starts to heal.  If the catheter was placed in your groin, you may lie in bed for up to a few hours. If the catheter was put in your wrist, you will need to keep your arm still for at least 1 hour.  How long does it take?  The test may take about 1 hour. But you need time to get ready   for the test and time to recover. This can take a few hours.  What happens after the test?  You may or may not need to stay in the hospital overnight. You will get more instructions for what to do at home.  Drink plenty of fluids for several hours after the test. If you have kidney, heart, or liver disease and have to limit fluids, talk with your doctor before you increase the amount of fluids you drink.  Follow-up care is a key part of your treatment and safety. Be sure to make and go to all appointments, and call your doctor if you are having problems. It's also a good idea to know your test results and keep a list of the medicines you take.  Where can you learn more?  Go to https://www.healthwise.net/patientEd and enter W306 to learn more about "Left Heart Catheterization: About This Test."  Current as of: September 04, 2021               Content Version:  13.8   2006-2023 [employer].   Care instructions adapted under license by [facility]. If you have questions about a medical condition or this instruction, always ask your healthcare professional. [employer] disclaims any warranty or liability for your use of this information.
